# Patient Record
Sex: Female | Born: 1960 | Race: White | Hispanic: No | Marital: Married | Smoking: Former smoker
Health system: Southern US, Community
[De-identification: ages and names within clinical notes are randomized; demographics above are authoritative.]

## PROBLEM LIST (undated history)

## (undated) DIAGNOSIS — M1990 Unspecified osteoarthritis, unspecified site: Secondary | ICD-10-CM

## (undated) DIAGNOSIS — E039 Hypothyroidism, unspecified: Secondary | ICD-10-CM

## (undated) DIAGNOSIS — R011 Cardiac murmur, unspecified: Secondary | ICD-10-CM

## (undated) DIAGNOSIS — L209 Atopic dermatitis, unspecified: Secondary | ICD-10-CM

## (undated) DIAGNOSIS — E079 Disorder of thyroid, unspecified: Secondary | ICD-10-CM

## (undated) DIAGNOSIS — F4310 Post-traumatic stress disorder, unspecified: Secondary | ICD-10-CM

## (undated) DIAGNOSIS — M797 Fibromyalgia: Secondary | ICD-10-CM

## (undated) HISTORY — DX: Fibromyalgia: M79.7

## (undated) HISTORY — PX: CHOLECYSTECTOMY: SHX55

## (undated) HISTORY — PX: MOUTH SURGERY: SHX715

## (undated) HISTORY — PX: COLON SURGERY: SHX602

## (undated) HISTORY — PX: ABDOMINAL HYSTERECTOMY: SHX81

---

## 2004-02-19 ENCOUNTER — Ambulatory Visit: Payer: Self-pay | Admitting: Family Medicine

## 2005-08-18 ENCOUNTER — Ambulatory Visit: Payer: Self-pay | Admitting: Family Medicine

## 2007-03-25 ENCOUNTER — Ambulatory Visit: Payer: Self-pay | Admitting: Family Medicine

## 2007-04-07 ENCOUNTER — Ambulatory Visit: Payer: Self-pay | Admitting: Family Medicine

## 2008-10-30 ENCOUNTER — Ambulatory Visit: Payer: Self-pay | Admitting: Internal Medicine

## 2009-05-28 ENCOUNTER — Ambulatory Visit: Payer: Self-pay | Admitting: Unknown Physician Specialty

## 2010-09-03 ENCOUNTER — Ambulatory Visit: Payer: Self-pay | Admitting: Internal Medicine

## 2010-09-11 ENCOUNTER — Ambulatory Visit: Payer: Self-pay | Admitting: Otolaryngology

## 2013-12-06 ENCOUNTER — Ambulatory Visit: Payer: Self-pay | Admitting: Family Medicine

## 2013-12-12 ENCOUNTER — Ambulatory Visit: Payer: Self-pay | Admitting: Family Medicine

## 2013-12-12 HISTORY — PX: BREAST BIOPSY: SHX20

## 2013-12-13 LAB — PATHOLOGY REPORT

## 2014-06-14 ENCOUNTER — Ambulatory Visit: Payer: Self-pay | Admitting: Specialist

## 2014-10-14 ENCOUNTER — Ambulatory Visit: Payer: BC Managed Care – PPO

## 2014-10-14 ENCOUNTER — Ambulatory Visit
Admission: EM | Admit: 2014-10-14 | Discharge: 2014-10-14 | Disposition: A | Discharge status: Home / Self Care | Payer: BC Managed Care – PPO | Attending: Emergency Medicine | Admitting: Emergency Medicine

## 2014-10-14 ENCOUNTER — Ambulatory Visit (HOSPITAL_COMMUNITY): Payer: BC Managed Care – PPO

## 2014-10-14 ENCOUNTER — Encounter: Payer: Self-pay | Admitting: Emergency Medicine

## 2014-10-14 DIAGNOSIS — J40 Bronchitis, not specified as acute or chronic: Secondary | ICD-10-CM

## 2014-10-14 DIAGNOSIS — R059 Cough, unspecified: Secondary | ICD-10-CM

## 2014-10-14 DIAGNOSIS — R05 Cough: Secondary | ICD-10-CM | POA: Diagnosis not present

## 2014-10-14 DIAGNOSIS — R0989 Other specified symptoms and signs involving the circulatory and respiratory systems: Secondary | ICD-10-CM | POA: Insufficient documentation

## 2014-10-14 HISTORY — DX: Atopic dermatitis, unspecified: L20.9

## 2014-10-14 HISTORY — DX: Unspecified osteoarthritis, unspecified site: M19.90

## 2014-10-14 HISTORY — DX: Disorder of thyroid, unspecified: E07.9

## 2014-10-14 HISTORY — DX: Post-traumatic stress disorder, unspecified: F43.10

## 2014-10-14 MED ORDER — IPRATROPIUM-ALBUTEROL 0.5-2.5 (3) MG/3ML IN SOLN
3.0000 mL | Freq: Once | RESPIRATORY_TRACT | Status: AC
  Administered 2014-10-14: 3 mL via RESPIRATORY_TRACT

## 2014-10-14 MED ORDER — HYDROCOD POLST-CPM POLST ER 10-8 MG/5ML PO SUER: 5.0000 mL | Freq: Two times a day (BID) | ORAL | Status: DC | PRN

## 2014-10-14 MED ORDER — ALBUTEROL SULFATE HFA 108 (90 BASE) MCG/ACT IN AERS: 1.0000 | INHALATION_SPRAY | Freq: Four times a day (QID) | RESPIRATORY_TRACT | Status: DC | PRN

## 2014-10-14 MED ORDER — PREDNISONE 50 MG PO TABS: 50.0000 mg | ORAL_TABLET | Freq: Every day | ORAL | Status: DC

## 2014-10-14 NOTE — ED Notes (Signed)
Patient presents here with c/o cough with yellow sputum since Thursday associted with migraine, denies any fever

## 2014-10-14 NOTE — ED Provider Notes (Addendum)
HPI  SUBJECTIVE:  Anne Sutton is a 54 y.o. female who presents with cough for 4 days. States it started becoming productive of "yellow stuff" yesterday. She reports wheezing, shortness of breath with exertion, chest soreness from all the coughing. She is sleeping at night okay. No vomiting, fevers. Symptoms are better with Cheratussin, no aggravating factors. She has not tried anything else for this. She also reports sinus pain/pressure starting last night, nasal congestion, postnasal drip. Denies sore throat, reflux type symptoms. States that the headache is secondary to the coughing states it is not new or different from previous headaches. Past medical history of migraines, asthmatic bronchitis, environmental allergies. No history of CHF, emphysema, COPD, hypertension. Patient is a former smoker.    Past Medical History  Diagnosis Date  . Arthritis   . Thyroid disease   . PTSD (post-traumatic stress disorder)   . Atopic dermatitis     Past Surgical History  Procedure Laterality Date  . Abdominal hysterectomy    . Cholecystectomy    . Colon surgery    . Mouth surgery      History reviewed. No pertinent family history.  History  Substance Use Topics  . Smoking status: Never Smoker   . Smokeless tobacco: Not on file  . Alcohol Use: No    No current facility-administered medications for this encounter.  Current outpatient prescriptions:  .  clonazePAM (KLONOPIN) 0.5 MG tablet, Take 0.5 mg by mouth 2 (two) times daily as needed for anxiety., Disp: , Rfl:  .  DULoxetine (CYMBALTA) 60 MG capsule, Take 60 mg by mouth daily., Disp: , Rfl:  .  levothyroxine (SYNTHROID, LEVOTHROID) 50 MCG tablet, Take 50 mcg by mouth daily before breakfast., Disp: , Rfl:  .  traMADol (ULTRAM) 50 MG tablet, Take by mouth every 6 (six) hours as needed., Disp: , Rfl:  .  albuterol (PROVENTIL HFA;VENTOLIN HFA) 108 (90 BASE) MCG/ACT inhaler, Inhale 1-2 puffs into the lungs every 6 (six) hours as  needed for wheezing or shortness of breath., Disp: 1 Inhaler, Rfl: 0 .  chlorpheniramine-HYDROcodone (TUSSIONEX PENNKINETIC ER) 10-8 MG/5ML SUER, Take 5 mLs by mouth every 12 (twelve) hours as needed for cough., Disp: 120 mL, Rfl: 0 .  predniSONE (DELTASONE) 50 MG tablet, Take 1 tablet (50 mg total) by mouth daily with breakfast., Disp: 5 tablet, Rfl: 0  Allergies  Allergen Reactions  . Hydromorphone Swelling  . Sulfa Antibiotics Anaphylaxis  . Toradol [Ketorolac Tromethamine] Swelling     ROS  As noted in HPI.   Physical Exam  BP 144/61 mmHg  Pulse 89  Temp(Src) 97.1 F (36.2 C) (Tympanic)  Resp 18  Ht 5\' 6"  (1.676 m)  Wt 281 lb (127.461 kg)  BMI 45.38 kg/m2  SpO2 95%  Constitutional: Well developed, well nourished, no acute distress Eyes:  EOMI, conjunctiva normal bilaterally HENT: Normocephalic, atraumatic,mucus membranes moist. Positive nasal congestion, b/l maxillary sinus tenderness Respiratory: Normal inspiratory effort, scattered wheezing, rhonchi that clear with coughing Cardiovascular: Regular rate and rhythm no murmurs rubs gallops GI: nondistended skin: No rash, skin intact Musculoskeletal: no deformities Neurologic: Alert & oriented x 3, no focal neuro deficits Psychiatric: Speech and behavior appropriate   ED Course  We'll give DuoNeb as pt has wheezing, check chest x-ray rule out pneumonia, plan is to send home with bronchodilators, steroids, Cheratussin, Mucinex D. Patient has neti pot, Flonase, at home she will start this up  Medications  ipratropium-albuterol (DUONEB) 0.5-2.5 (3) MG/3ML nebulizer solution 3 mL (3 mLs  Nebulization Given 10/14/14 0910)    Orders Placed This Encounter  Procedures  . DG Chest 2 View    Standing Status: Standing     Number of Occurrences: 1     Standing Expiration Date:     Order Specific Question:  Reason for exam:    Answer:  COUGH    Order Specific Question:  Reason for exam:    Answer:  RHONCHI    No results  found for this or any previous visit (from the past 24 hour(s)). Dg Chest 2 View  10/14/2014   CLINICAL DATA:  Cough and increased rhonchi  EXAM: CHEST - 2 VIEW  COMPARISON:  06/14/2014  FINDINGS: The heart size and mediastinal contours are within normal limits. Both lungs are clear. The visualized skeletal structures are unremarkable.  IMPRESSION: No active disease.   Electronically Signed   By: Alcide Clever M.D.   On: 10/14/2014 10:53   Dg Chest 2 View  10/14/2014   CLINICAL DATA:  Cough and increased rhonchi  EXAM: CHEST - 2 VIEW  COMPARISON:  06/14/2014  FINDINGS: The heart size and mediastinal contours are within normal limits. Both lungs are clear. The visualized skeletal structures are unremarkable.  IMPRESSION: No active disease.   Electronically Signed   By: Alcide Clever M.D.   On: 10/14/2014 10:53   ED Clinical Impression  Bronchitis  ED Assessment/Plan  We'll treat with bronchodilators, steroids, cough syrup. We'll call in antibiotics if x-ray comes back positive for pneumonia.  X-ray negative for pneumonia. See radiology report for full details. Plan as above  Reviewed imaging independently.  imaging normal. See radiology report for full details.  Discussed labs, imaging, MDM, plan and followup with patient. Discussed sn/sx that should prompt return to the UC or ED. Patient agrees with plan.   *This clinic note was created using Dragon dictation software. Therefore, there may be occasional mistakes despite careful proofreading.  ?   Domenick Gong, MD 10/14/14 1106  Domenick Gong, MD 10/14/14 1108

## 2015-02-05 ENCOUNTER — Other Ambulatory Visit: Payer: Self-pay | Admitting: Family Medicine

## 2015-02-05 DIAGNOSIS — Z1231 Encounter for screening mammogram for malignant neoplasm of breast: Secondary | ICD-10-CM

## 2015-02-12 ENCOUNTER — Ambulatory Visit
Admission: RE | Admit: 2015-02-12 | Discharge: 2015-02-12 | Disposition: A | Discharge status: Home / Self Care | Payer: BC Managed Care – PPO | Source: Ambulatory Visit | Attending: Family Medicine | Admitting: Family Medicine

## 2015-02-12 DIAGNOSIS — Z1231 Encounter for screening mammogram for malignant neoplasm of breast: Secondary | ICD-10-CM

## 2015-02-20 ENCOUNTER — Encounter: Payer: Self-pay | Admitting: Obstetrics and Gynecology

## 2016-01-30 ENCOUNTER — Other Ambulatory Visit: Payer: Self-pay | Admitting: Family Medicine

## 2016-01-30 DIAGNOSIS — Z1231 Encounter for screening mammogram for malignant neoplasm of breast: Secondary | ICD-10-CM

## 2016-02-22 ENCOUNTER — Ambulatory Visit: Payer: BC Managed Care – PPO

## 2016-02-29 ENCOUNTER — Ambulatory Visit
Admission: RE | Admit: 2016-02-29 | Discharge: 2016-02-29 | Disposition: A | Discharge status: Home / Self Care | Payer: BC Managed Care – PPO | Source: Ambulatory Visit | Attending: Family Medicine | Admitting: Family Medicine

## 2016-02-29 DIAGNOSIS — Z1231 Encounter for screening mammogram for malignant neoplasm of breast: Secondary | ICD-10-CM | POA: Insufficient documentation

## 2016-10-07 IMAGING — CR DG CHEST 2V
1 series · 2 of 2 positions shown · non-contrast
Comparison: None.

CLINICAL DATA: Preoperative evaluation prior to bariatric surgery,
morbid obesity, sleep apnea.

EXAM:
CHEST  2 VIEW

[Series 1: dxr chest pa (or ap) and lateral · 0.14mm/px · 2 of 2 slices shown]
[im 1/2]
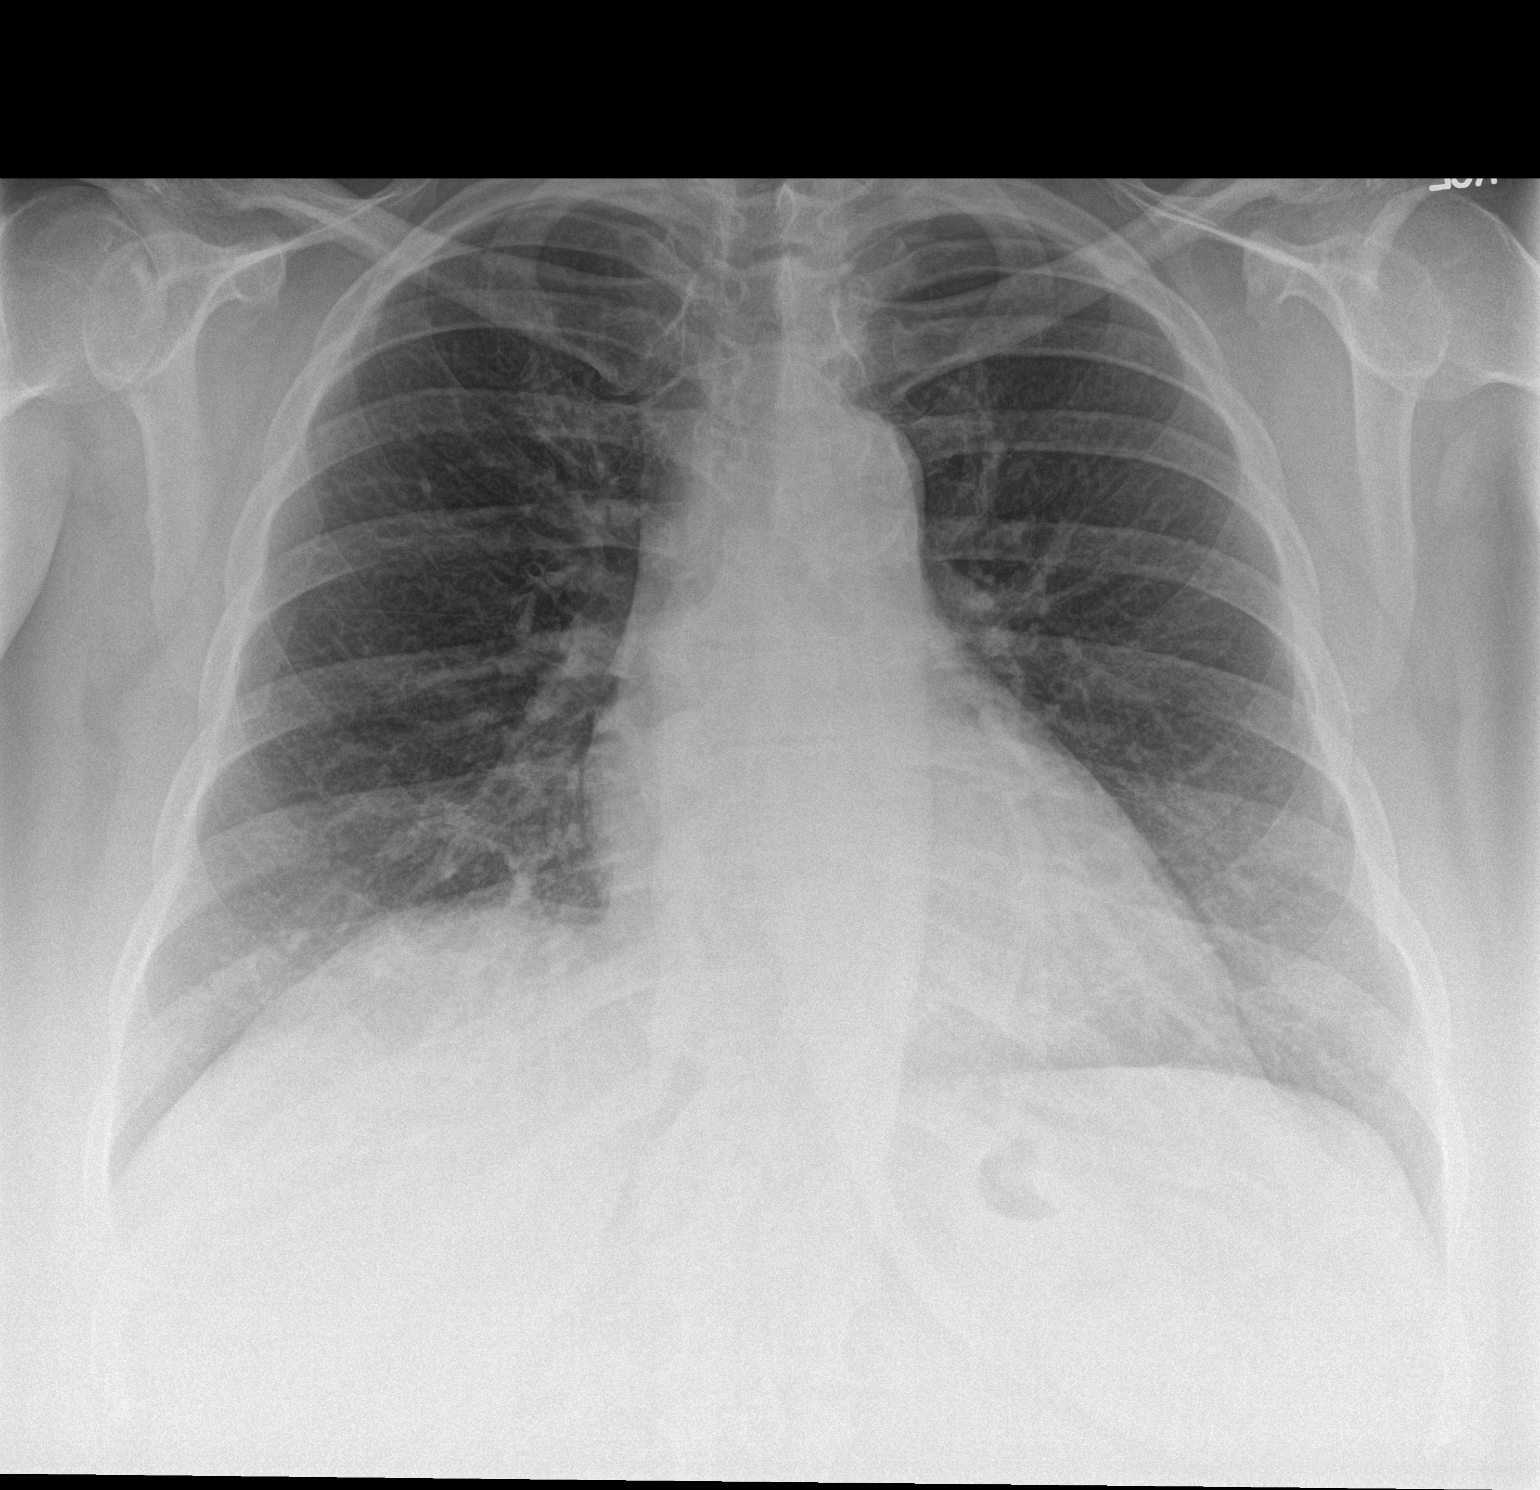
[im 2/2]
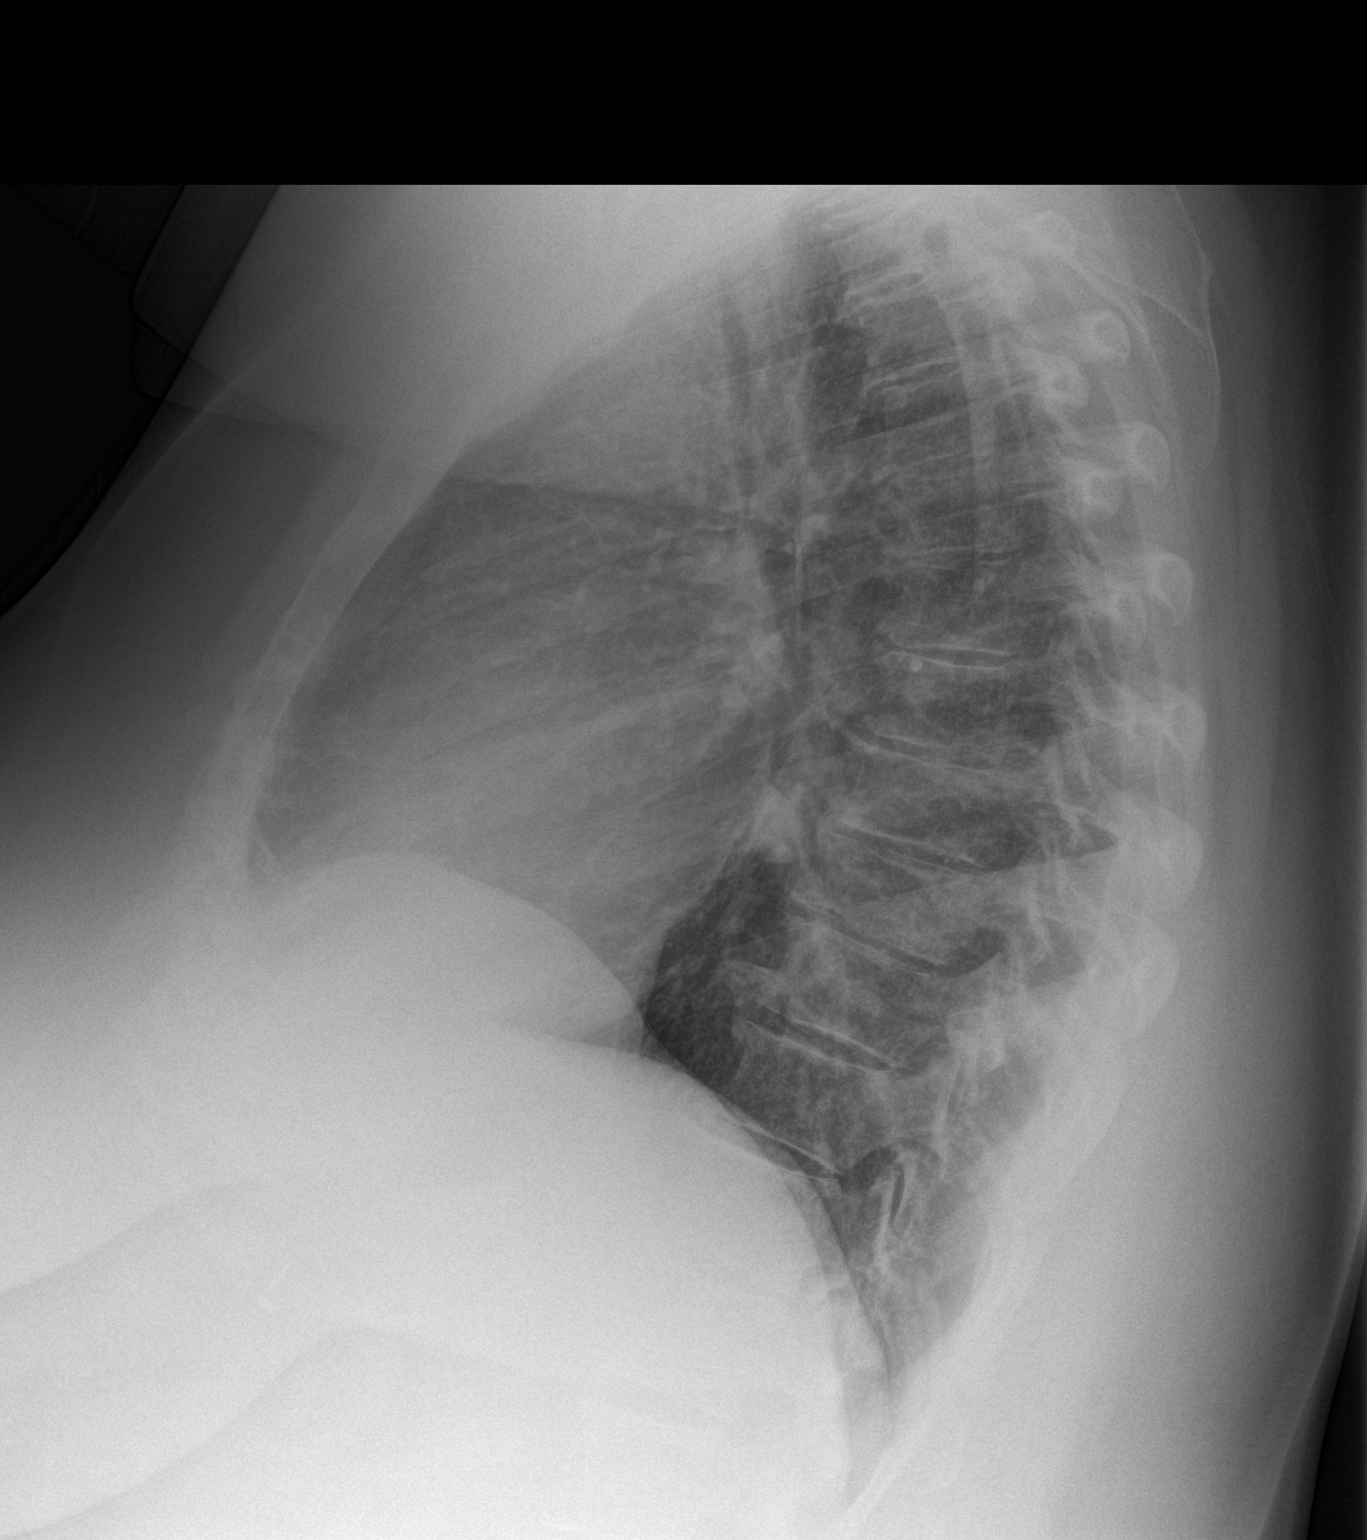

[2 of 2 positions shown; findings below may reference images not displayed]

FINDINGS: The lungs are adequately inflated. There is no focal infiltrate.
There is no pleural effusion. The heart and pulmonary vascularity
are normal. The mediastinum is normal in width. There is mild
degenerative disc space narrowing at multiple thoracic levels. There
is AC joint degenerative change.
IMPRESSION: There is no active cardiopulmonary disease.

## 2017-12-25 ENCOUNTER — Other Ambulatory Visit: Payer: Self-pay | Admitting: Family Medicine

## 2017-12-25 DIAGNOSIS — Z1231 Encounter for screening mammogram for malignant neoplasm of breast: Secondary | ICD-10-CM

## 2018-01-15 ENCOUNTER — Ambulatory Visit
Admission: RE | Admit: 2018-01-15 | Discharge: 2018-01-15 | Disposition: A | Discharge status: Home / Self Care | Payer: BC Managed Care – PPO | Source: Ambulatory Visit | Attending: Family Medicine | Admitting: Family Medicine

## 2018-01-15 DIAGNOSIS — Z1231 Encounter for screening mammogram for malignant neoplasm of breast: Secondary | ICD-10-CM | POA: Insufficient documentation

## 2019-05-16 ENCOUNTER — Other Ambulatory Visit: Payer: Self-pay | Admitting: Family Medicine

## 2019-05-16 DIAGNOSIS — Z1231 Encounter for screening mammogram for malignant neoplasm of breast: Secondary | ICD-10-CM

## 2019-05-19 ENCOUNTER — Ambulatory Visit
Admission: RE | Admit: 2019-05-19 | Discharge: 2019-05-19 | Disposition: A | Discharge status: Home / Self Care | Payer: BC Managed Care – PPO | Source: Ambulatory Visit | Attending: Family Medicine | Admitting: Family Medicine

## 2019-05-19 DIAGNOSIS — Z1231 Encounter for screening mammogram for malignant neoplasm of breast: Secondary | ICD-10-CM | POA: Insufficient documentation

## 2021-04-01 ENCOUNTER — Other Ambulatory Visit: Payer: Self-pay | Admitting: Family Medicine

## 2021-04-01 DIAGNOSIS — Z1231 Encounter for screening mammogram for malignant neoplasm of breast: Secondary | ICD-10-CM

## 2021-04-02 ENCOUNTER — Other Ambulatory Visit: Payer: Self-pay | Admitting: Family Medicine

## 2021-04-09 ENCOUNTER — Other Ambulatory Visit: Payer: Self-pay | Admitting: Family Medicine

## 2021-04-09 DIAGNOSIS — Z780 Asymptomatic menopausal state: Secondary | ICD-10-CM

## 2021-06-05 ENCOUNTER — Ambulatory Visit
Admission: RE | Admit: 2021-06-05 | Discharge: 2021-06-05 | Disposition: A | Discharge status: Home / Self Care | Payer: BC Managed Care – PPO | Source: Ambulatory Visit | Attending: Family Medicine | Admitting: Family Medicine

## 2021-06-05 ENCOUNTER — Other Ambulatory Visit: Payer: Self-pay

## 2021-06-05 DIAGNOSIS — Z780 Asymptomatic menopausal state: Secondary | ICD-10-CM

## 2021-06-05 DIAGNOSIS — Z1231 Encounter for screening mammogram for malignant neoplasm of breast: Secondary | ICD-10-CM | POA: Diagnosis not present

## 2021-10-24 ENCOUNTER — Encounter: Payer: Self-pay | Admitting: *Deleted

## 2021-10-25 ENCOUNTER — Ambulatory Visit
Admission: RE | Admit: 2021-10-25 | Discharge: 2021-10-25 | Disposition: A | Discharge status: Home / Self Care | Payer: BC Managed Care – PPO | Attending: Gastroenterology | Admitting: Gastroenterology

## 2021-10-25 ENCOUNTER — Ambulatory Visit: Payer: BC Managed Care – PPO | Admitting: Anesthesiology

## 2021-10-25 ENCOUNTER — Encounter
Admission: RE | Disposition: A | Discharge status: Home / Self Care | Payer: Self-pay | Source: Home / Self Care | Attending: Gastroenterology

## 2021-10-25 DIAGNOSIS — Q438 Other specified congenital malformations of intestine: Secondary | ICD-10-CM | POA: Diagnosis not present

## 2021-10-25 DIAGNOSIS — K640 First degree hemorrhoids: Secondary | ICD-10-CM | POA: Diagnosis not present

## 2021-10-25 DIAGNOSIS — E039 Hypothyroidism, unspecified: Secondary | ICD-10-CM | POA: Diagnosis not present

## 2021-10-25 DIAGNOSIS — K5730 Diverticulosis of large intestine without perforation or abscess without bleeding: Secondary | ICD-10-CM | POA: Diagnosis not present

## 2021-10-25 DIAGNOSIS — Z6833 Body mass index (BMI) 33.0-33.9, adult: Secondary | ICD-10-CM | POA: Diagnosis not present

## 2021-10-25 DIAGNOSIS — M1990 Unspecified osteoarthritis, unspecified site: Secondary | ICD-10-CM | POA: Insufficient documentation

## 2021-10-25 DIAGNOSIS — Z8371 Family history of colonic polyps: Secondary | ICD-10-CM | POA: Insufficient documentation

## 2021-10-25 DIAGNOSIS — Z1211 Encounter for screening for malignant neoplasm of colon: Secondary | ICD-10-CM | POA: Insufficient documentation

## 2021-10-25 HISTORY — DX: Hypothyroidism, unspecified: E03.9

## 2021-10-25 HISTORY — DX: Cardiac murmur, unspecified: R01.1

## 2021-10-25 HISTORY — PX: COLONOSCOPY WITH PROPOFOL: SHX5780

## 2021-10-25 SURGERY — COLONOSCOPY WITH PROPOFOL
Anesthesia: General

## 2021-10-25 MED ORDER — PROPOFOL 10 MG/ML IV BOLUS
INTRAVENOUS | Status: DC | PRN
  Administered 2021-10-25: 60 mg via INTRAVENOUS

## 2021-10-25 MED ORDER — PROPOFOL 500 MG/50ML IV EMUL
INTRAVENOUS | Status: DC | PRN
  Administered 2021-10-25: 200 ug/kg/min via INTRAVENOUS

## 2021-10-25 MED ORDER — SODIUM CHLORIDE 0.9 % IV SOLN: INTRAVENOUS | Status: DC

## 2021-10-25 NOTE — Anesthesia Postprocedure Evaluation (Signed)
Anesthesia Post Note  Patient: Anne Sutton  Procedure(s) Performed: COLONOSCOPY WITH PROPOFOL  Patient location during evaluation: PACU Anesthesia Type: General Level of consciousness: awake and oriented Pain management: pain level controlled Vital Signs Assessment: post-procedure vital signs reviewed and stable Respiratory status: respiratory function stable and nonlabored ventilation Cardiovascular status: stable Anesthetic complications: no   No notable events documented.   Last Vitals:  Vitals:   10/25/21 1115 10/25/21 1125  BP: (!) 108/55 123/65  Pulse: 74   Resp: 13 12  Temp:    SpO2: 100% 100%    Last Pain:  Vitals:   10/25/21 1125  TempSrc:   PainSc: 0-No pain                 VAN STAVEREN,Ashlen Kiger

## 2021-10-28 ENCOUNTER — Encounter: Payer: Self-pay | Admitting: Gastroenterology

## 2023-03-01 ENCOUNTER — Emergency Department
Admission: EM | Admit: 2023-03-01 | Discharge: 2023-03-01 | Disposition: A | Discharge status: Home / Self Care | Payer: BC Managed Care – PPO | Attending: Emergency Medicine | Admitting: Emergency Medicine

## 2023-03-01 ENCOUNTER — Other Ambulatory Visit: Payer: Self-pay

## 2023-03-01 DIAGNOSIS — S8011XA Contusion of right lower leg, initial encounter: Secondary | ICD-10-CM | POA: Diagnosis not present

## 2023-03-01 DIAGNOSIS — S0083XA Contusion of other part of head, initial encounter: Secondary | ICD-10-CM | POA: Insufficient documentation

## 2023-03-01 DIAGNOSIS — W231XXA Caught, crushed, jammed, or pinched between stationary objects, initial encounter: Secondary | ICD-10-CM | POA: Diagnosis not present

## 2023-03-01 DIAGNOSIS — S0990XA Unspecified injury of head, initial encounter: Secondary | ICD-10-CM
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
## 2023-03-01 NOTE — Discharge Instructions (Signed)
Your exam was reassuring today. Since you did not have any neurological deficits at this time we held off on getting imaging. If in the next few hours or days you have worsening headaches, multiple episodes of vomiting, dizziness, blurry vision, difficulty walking or memory issues please return to the ED.  You can take 650 mg of Tylenol and 600 mg of ibuprofen every 6 hours as needed for pain.

## 2023-03-23 ENCOUNTER — Other Ambulatory Visit: Payer: Self-pay | Admitting: Family Medicine

## 2023-03-23 DIAGNOSIS — Z1231 Encounter for screening mammogram for malignant neoplasm of breast: Secondary | ICD-10-CM

## 2023-05-14 ENCOUNTER — Ambulatory Visit
Admission: RE | Admit: 2023-05-14 | Discharge: 2023-05-14 | Disposition: A | Discharge status: Home / Self Care | Payer: 59 | Source: Ambulatory Visit | Attending: Family Medicine | Admitting: Family Medicine

## 2023-05-14 DIAGNOSIS — Z1231 Encounter for screening mammogram for malignant neoplasm of breast: Secondary | ICD-10-CM | POA: Diagnosis present

## 2023-09-29 IMAGING — MG MM DIGITAL SCREENING BILAT W/ TOMO AND CAD
8 series · 8 of 24 positions shown · non-contrast
Comparison: Previous exam(s).

CLINICAL DATA: Screening.

EXAM:
DIGITAL SCREENING BILATERAL MAMMOGRAM WITH TOMOSYNTHESIS AND CAD
TECHNIQUE: Bilateral screening digital craniocaudal and mediolateral oblique
mammograms were obtained. Bilateral screening digital breast
tomosynthesis was performed. The images were evaluated with
computer-aided detection.

[R CC synth-2D]
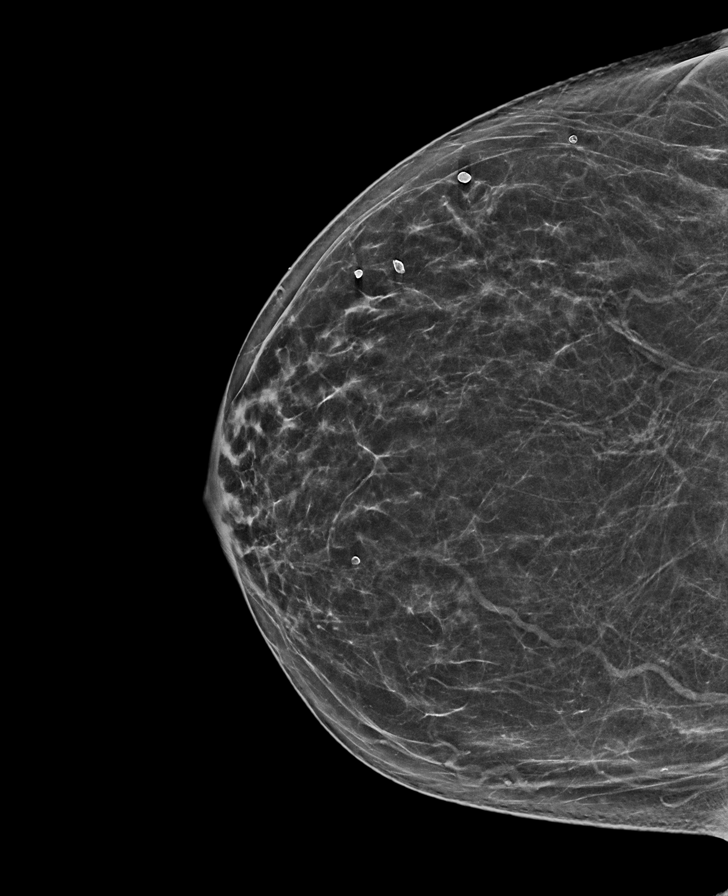

[L MLO synth-2D]
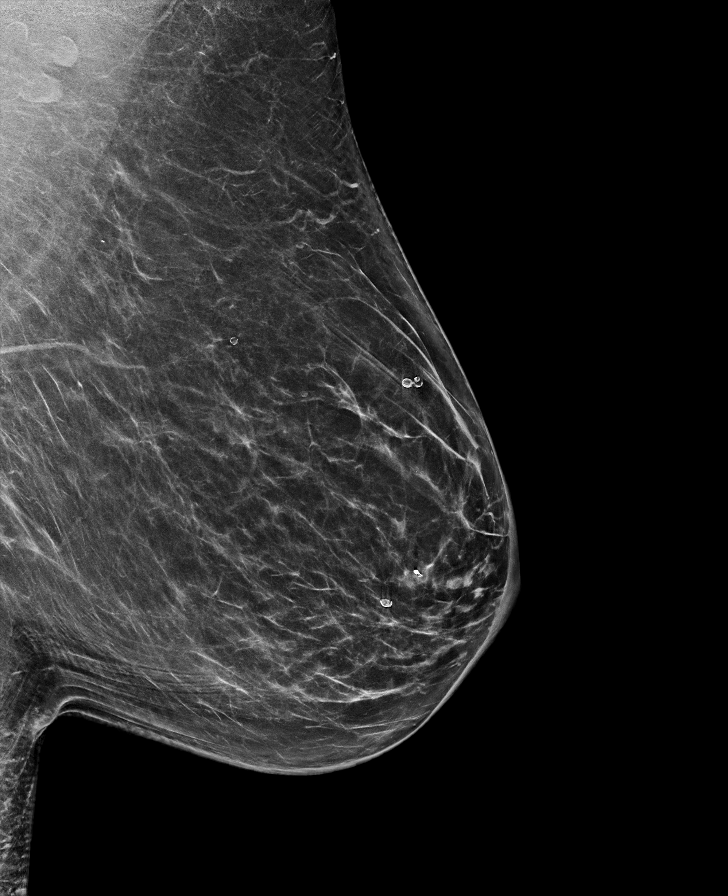

[R MLO synth-2D]
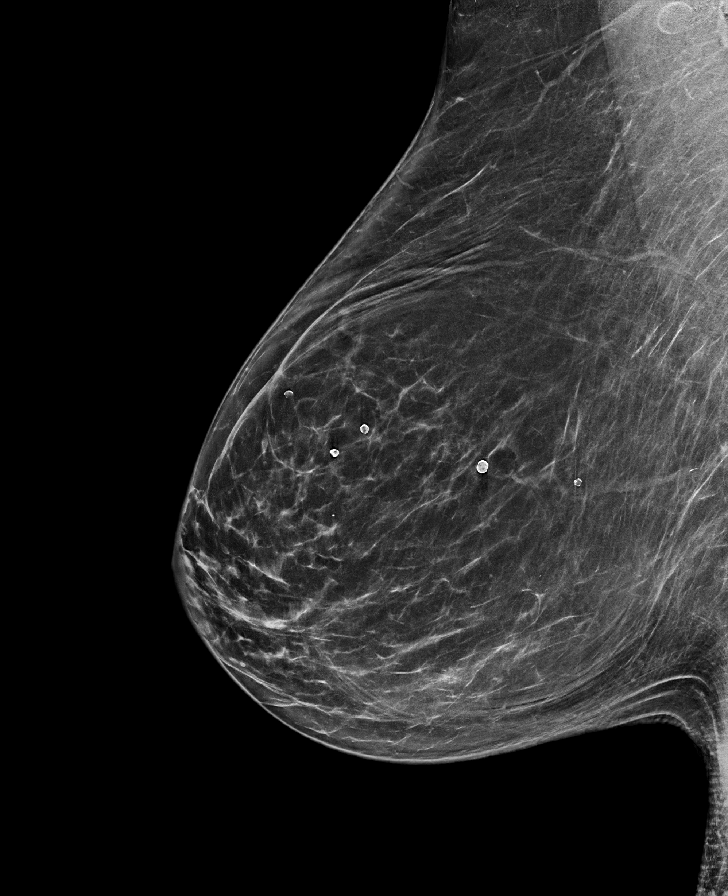

[L CC synth-2D]
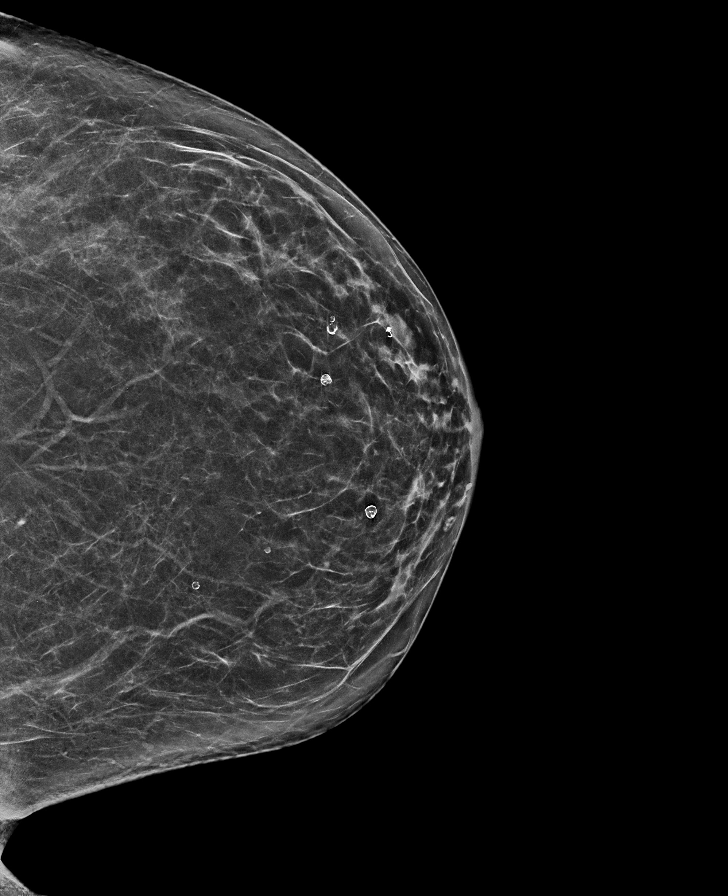

[R CC tomo · tomo slice 35/70.0]
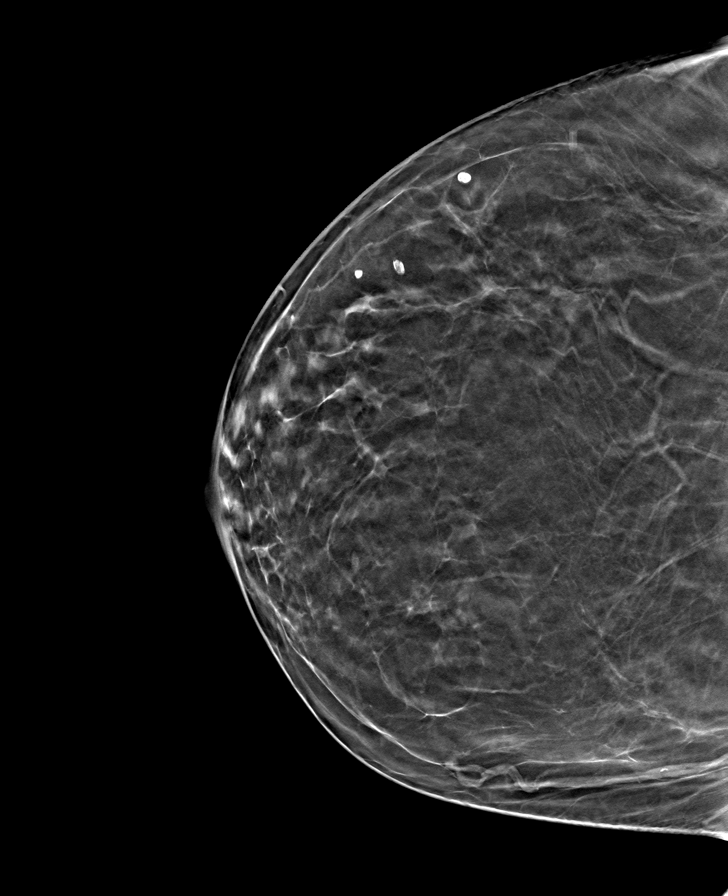

[L CC tomo · tomo slice 37/74.0]
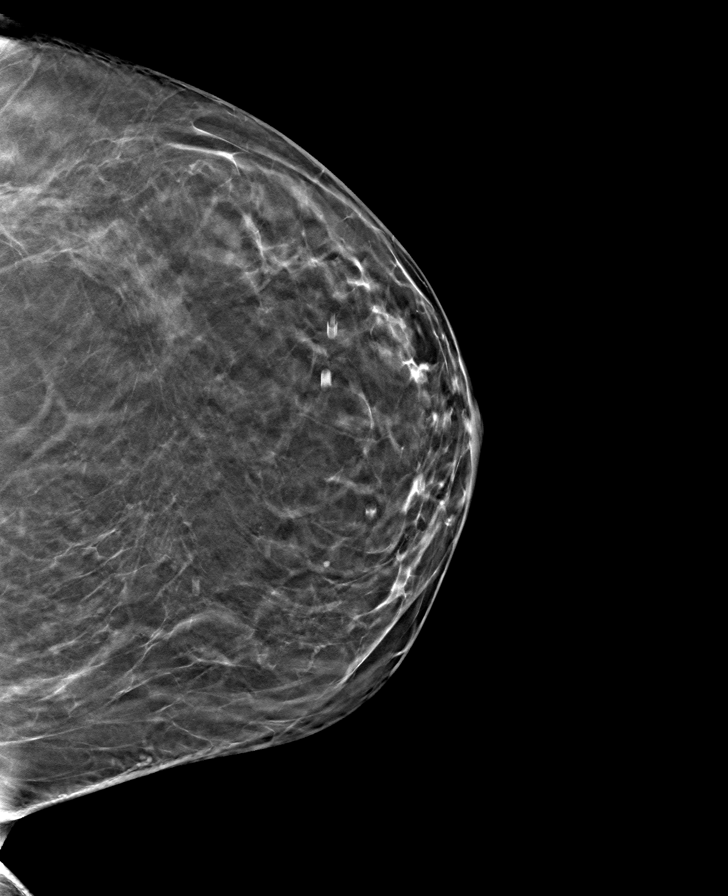

[R MLO tomo · tomo slice 41/82.0]
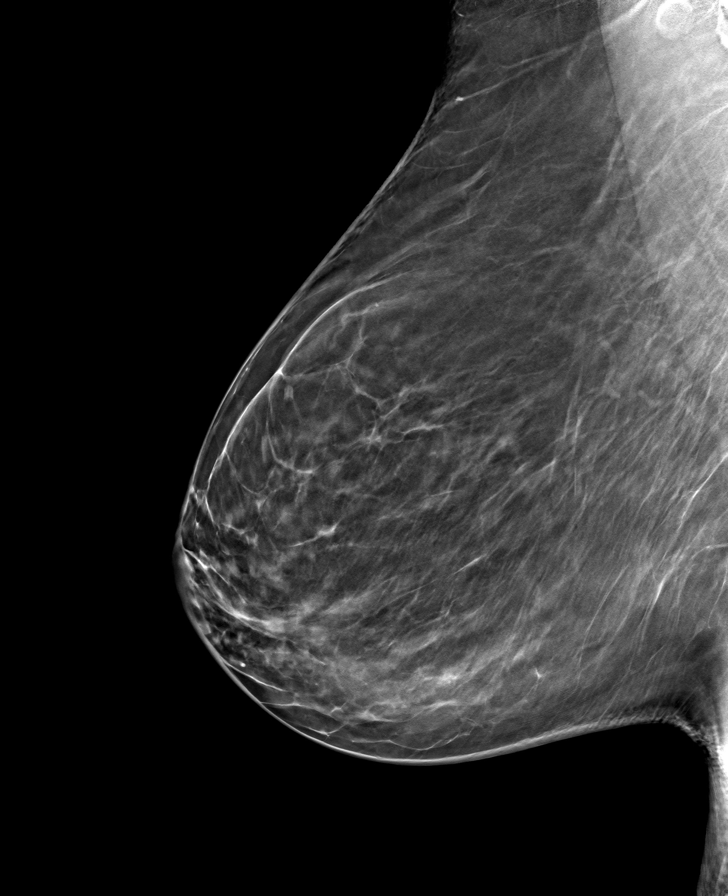

[L MLO tomo · tomo slice 43/84.0]
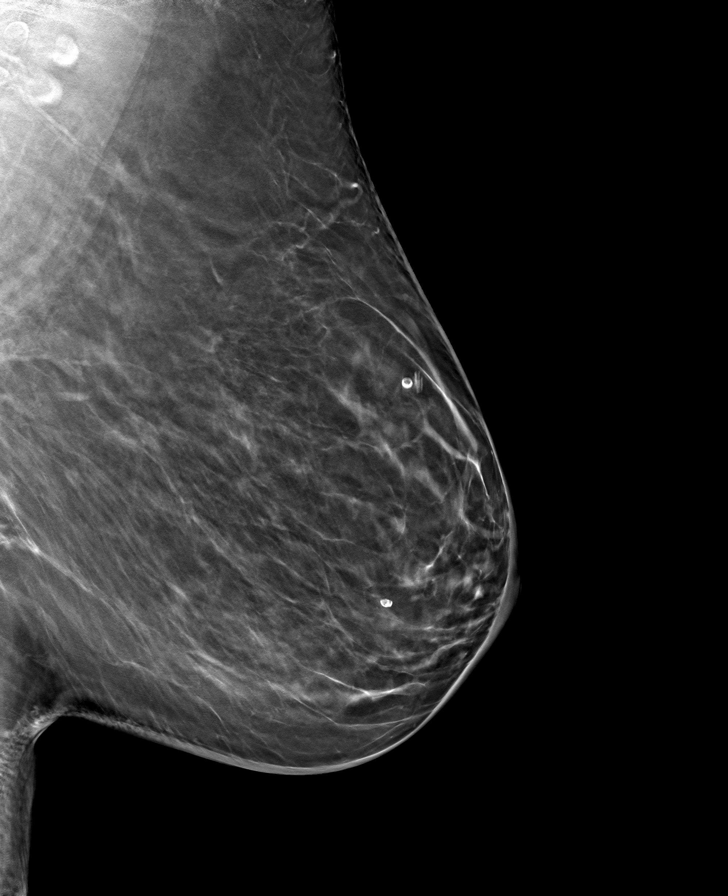

[8 of 24 positions shown; findings below may reference images not displayed]

ACR Breast Density Category b: There are scattered areas of
fibroglandular density.
FINDINGS: There are no findings suspicious for malignancy.
IMPRESSION: No mammographic evidence of malignancy. A result letter of this
screening mammogram will be mailed directly to the patient.

RECOMMENDATION:
Screening mammogram in one year. (Code:51-O-LD2)

BI-RADS CATEGORY  1: Negative.

## 2024-04-11 ENCOUNTER — Inpatient Hospital Stay: Attending: Oncology | Admitting: Oncology

## 2024-04-11 ENCOUNTER — Encounter: Payer: Self-pay | Admitting: Oncology

## 2024-04-11 ENCOUNTER — Inpatient Hospital Stay

## 2024-04-11 VITALS — BP 150/70 | HR 58 | Temp 97.4°F | Resp 18 | Wt 208.1 lb

## 2024-04-11 DIAGNOSIS — Z87891 Personal history of nicotine dependence: Secondary | ICD-10-CM | POA: Diagnosis not present

## 2024-04-11 DIAGNOSIS — Z808 Family history of malignant neoplasm of other organs or systems: Secondary | ICD-10-CM | POA: Insufficient documentation

## 2024-04-11 DIAGNOSIS — D72810 Lymphocytopenia: Secondary | ICD-10-CM

## 2024-04-11 DIAGNOSIS — D508 Other iron deficiency anemias: Secondary | ICD-10-CM | POA: Diagnosis present

## 2024-04-11 DIAGNOSIS — Z9884 Bariatric surgery status: Secondary | ICD-10-CM | POA: Diagnosis not present

## 2024-04-11 DIAGNOSIS — D509 Iron deficiency anemia, unspecified: Secondary | ICD-10-CM | POA: Insufficient documentation

## 2024-04-11 LAB — CBC WITH DIFFERENTIAL/PLATELET
Abs Immature Granulocytes: 0.02 K/uL (ref 0.00–0.07)
Basophils Absolute: 0.1 K/uL (ref 0.0–0.1)
Basophils Relative: 1 %
Eosinophils Absolute: 0.2 K/uL (ref 0.0–0.5)
Eosinophils Relative: 4 %
HCT: 34.2 % — ABNORMAL LOW (ref 36.0–46.0)
Hemoglobin: 10.6 g/dL — ABNORMAL LOW (ref 12.0–15.0)
Immature Granulocytes: 0 %
Lymphocytes Relative: 27 %
Lymphs Abs: 1.5 K/uL (ref 0.7–4.0)
MCH: 27.3 pg (ref 26.0–34.0)
MCHC: 31 g/dL (ref 30.0–36.0)
MCV: 88.1 fL (ref 80.0–100.0)
Monocytes Absolute: 0.4 K/uL (ref 0.1–1.0)
Monocytes Relative: 7 %
Neutro Abs: 3.4 K/uL (ref 1.7–7.7)
Neutrophils Relative %: 61 %
Platelets: 221 K/uL (ref 150–400)
RBC: 3.88 MIL/uL (ref 3.87–5.11)
RDW: 15.8 % — ABNORMAL HIGH (ref 11.5–15.5)
WBC: 5.5 K/uL (ref 4.0–10.5)
nRBC: 0 % (ref 0.0–0.2)

## 2024-04-11 LAB — RETIC PANEL
Immature Retic Fract: 7.4 % (ref 2.3–15.9)
RBC.: 3.85 MIL/uL — ABNORMAL LOW (ref 3.87–5.11)
Retic Count, Absolute: 44.7 K/uL (ref 19.0–186.0)
Retic Ct Pct: 1.2 % (ref 0.4–3.1)
Reticulocyte Hemoglobin: 32.4 pg (ref >27.9)

## 2024-04-11 LAB — HEPATIC FUNCTION PANEL
ALT: 23 U/L (ref 0–44)
AST: 22 U/L (ref 15–41)
Albumin: 4.4 g/dL (ref 3.5–5.0)
Alkaline Phosphatase: 66 U/L (ref 38–126)
Bilirubin, Direct: 0.1 mg/dL (ref 0.0–0.2)
Indirect Bilirubin: 0.1 mg/dL — ABNORMAL LOW (ref 0.3–0.9)
Total Bilirubin: 0.2 mg/dL (ref 0.0–1.2)
Total Protein: 7.1 g/dL (ref 6.5–8.1)

## 2024-04-11 LAB — LACTATE DEHYDROGENASE: LDH: 188 U/L (ref 105–235)

## 2024-04-11 NOTE — Assessment & Plan Note (Signed)
 Labs are reviewed and discussed with patient. Consistent with iron deficiency anemia.  Today's cbc showed hb of 10.6, iron levels are pending.   I discussed about option IV Venofer treatments. I discussed about the potential risks including but not limited to allergic reactions/infusion reactions including anaphylactic reactions, diarrhea, phlebitis, high blood pressure, wheezing, SOB, skin rash, weight gain,dark urine, leg swelling, back pain, headache, nausea and fatigue, etc. Patient agrees with the plan.  Plan IV venofer weekly x 3

## 2024-04-11 NOTE — Assessment & Plan Note (Signed)
Check B12, and folate.

## 2024-04-11 NOTE — Progress Notes (Unsigned)
Pt here to establish care for anemia.  °

## 2024-04-11 NOTE — Assessment & Plan Note (Addendum)
 Check flowcytometry, b12, folate, ldh Resolved on repeat cbc today

## 2024-04-11 NOTE — Progress Notes (Unsigned)
 Hematology/Oncology Consult note [phone] [phone]        REFERRING PROVIDER: Donal Channing SQUIBB, FNP   CHIEF COMPLAINTS/REASON FOR VISIT:  Evaluation of IDA   ASSESSMENT & PLAN:   Iron deficiency anemia Labs are reviewed and discussed with patient. Consistent with iron deficiency anemia.  Today's cbc showed hb of 10.6, iron levels are pending.   I discussed about option IV Venofer treatments. I discussed about the potential risks including but not limited to allergic reactions/infusion reactions including anaphylactic reactions, diarrhea, phlebitis, high blood pressure, wheezing, SOB, skin rash, weight gain,dark urine, leg swelling, back pain, headache, nausea and fatigue, etc. Patient agrees with the plan.  Plan IV venofer weekly x 3  Lymphopenia Check flowcytometry, b12, folate, ldh Resolved on repeat cbc today  History of bariatric surgery Check B12, and folate   Orders Placed This Encounter  Procedures   Folate    Standing Status:   Future    Number of Occurrences:   1    Expected Date:   04/11/2024    Expiration Date:   07/10/2024   Iron and TIBC    Standing Status:   Future    Number of Occurrences:   1    Expected Date:   04/11/2024    Expiration Date:   07/10/2024   Ferritin    Standing Status:   Future    Number of Occurrences:   1    Expected Date:   04/11/2024    Expiration Date:   07/10/2024   Vitamin B12    Standing Status:   Future    Number of Occurrences:   1    Expected Date:   04/11/2024    Expiration Date:   07/10/2024   CBC with Differential/Platelet    Standing Status:   Future    Number of Occurrences:   1    Expected Date:   04/11/2024    Expiration Date:   07/10/2024   Retic Panel    Standing Status:   Future    Number of Occurrences:   1    Expected Date:   04/11/2024    Expiration Date:   07/10/2024   Flow cytometry panel-leukemia/lymphoma work-up    Standing Status:   Future    Number of Occurrences:   1     Expected Date:   04/11/2024    Expiration Date:   07/10/2024   Hepatic function panel    Standing Status:   Future    Number of Occurrences:   1    Expected Date:   04/11/2024    Expiration Date:   07/10/2024   HIV Antibody (routine testing w rflx)    Standing Status:   Future    Number of Occurrences:   1    Expected Date:   04/11/2024    Expiration Date:   07/10/2024   Lactate dehydrogenase    Standing Status:   Future    Number of Occurrences:   1    Expected Date:   04/11/2024    Expiration Date:   07/10/2024    All questions were answered. The patient knows to call the clinic with any problems, questions or concerns.  Zelphia Cap, MD, PhD [facility] Hematology Oncology 04/11/2024   HISTORY OF PRESENTING ILLNESS:   Anne Sutton is a  63 y.o.  female with PMH listed below was seen in consultation at the request of  Donal Channing SQUIBB, FNP  for evaluation of IDA Discussed  the use of AI scribe software for clinical note transcription with the patient, who gave verbal consent to proceed.   Anne Sutton has longstanding iron deficiency anemia, previously requiring vaginal hysterectomy for refractory menorrhagia. Since undergoing bariatric surgery approximately 10-11 years ago, Anne Sutton has had  low ferritin (most recently 4.9 ng/mL), low iron saturation, and hemoglobin around 10 g/dL on her lab work obtain by PCP. Anne Sutton has tried a few weeks of oral iron supplementation.  Anne Sutton continues to experience chronic fatigue and low energy, which Anne Sutton describes as always down. Anne Sutton denies shortness of breath, chest pain, palpitations, and current gastrointestinal bleeding. Anne Sutton underwent colonoscopy in summer 2023.  Anne Sutton takes a multivitamin containing B12 and folate,Anne Sutton has tried seven-month gluten-free diet which makes her feel well.     MEDICAL HISTORY:  Past Medical History:  Diagnosis Date   Arthritis    Atopic dermatitis    Fibromyalgia    Heart murmur    Hypothyroidism    PTSD (post-traumatic stress  disorder)    Thyroid disease     SURGICAL HISTORY: Past Surgical History:  Procedure Laterality Date   ABDOMINAL HYSTERECTOMY     BREAST BIOPSY Left 12/12/2013   neg   CHOLECYSTECTOMY     COLON SURGERY     COLONOSCOPY WITH PROPOFOL  N/A 10/25/2021   Procedure: COLONOSCOPY WITH PROPOFOL ;  Surgeon: Maryruth Ole DASEN, MD;  Location: [facility] ENDOSCOPY;  Service: Endoscopy;  Laterality: N/A;   DILATION AND CURETTAGE OF UTERUS     HERNIA REPAIR     MOUTH SURGERY     TONSILLECTOMY     VAGINECTOMY, PARTIAL      SOCIAL HISTORY: Social History   Socioeconomic History   Marital status: Married    Spouse name: Not on file   Number of children: Not on file   Years of education: Not on file   Highest education level: Not on file  Occupational History   Not on file  Tobacco Use   Smoking status: Former    Types: Cigarettes   Smokeless tobacco: Not on file  Vaping Use   Vaping status: Never Used  Substance and Sexual Activity   Alcohol use: No   Drug use: Not on file   Sexual activity: Not on file  Other Topics Concern   Not on file  Social History Narrative   Not on file   Social Drivers of Health   Financial Resource Strain: Not on file  Food Insecurity: No Food Insecurity (04/11/2024)   Hunger Vital Sign    Worried About Running Out of Food in the Last Year: Never true    Ran Out of Food in the Last Year: Never true  Transportation Needs: No Transportation Needs (04/11/2024)   PRAPARE - Administrator, Civil Service (Medical): No    Lack of Transportation (Non-Medical): No  Physical Activity: Not on file  Stress: Not on file  Social Connections: Not on file  Intimate Partner Violence: Not At Risk (04/11/2024)   Humiliation, Afraid, Rape, and Kick questionnaire    Fear of Current or Ex-Partner: No    Emotionally Abused: No    Physically Abused: No    Sexually Abused: No    FAMILY HISTORY: Family History  Problem Relation Age of Onset   Alcohol abuse  Mother    Diabetes Father    Hypertension Brother    Diabetes Brother    Tongue cancer Maternal Uncle    Celiac disease Daughter    Breast cancer Neg Hx  ALLERGIES:  is allergic to hydromorphone, sulfa antibiotics, toradol [ketorolac tromethamine], and tape.  MEDICATIONS:  Current Outpatient Medications  Medication Sig Dispense Refill   calcium citrate (CALCITRATE - DOSED IN MG ELEMENTAL CALCIUM) 950 (200 Ca) MG tablet Take 200 mg of elemental calcium by mouth 2 (two) times daily.     Cholecalciferol (VITAMIN D3) 1.25 MG (50000 UT) CAPS Take by mouth once a week. (Patient taking differently: Take by mouth 2 (two) times a week.)     diclofenac (VOLTAREN) 75 MG EC tablet Take 75 mg by mouth at bedtime.     DULoxetine (CYMBALTA) 60 MG capsule Take 60 mg by mouth daily.     ferrous sulfate 325 (65 FE) MG EC tablet Take 325 mg by mouth 2 (two) times daily.     melatonin 3 MG TABS tablet Take 3 mg by mouth at bedtime.     Multiple Vitamin (MULTIVITAMIN) tablet Take 1 tablet by mouth daily.     omeprazole (PRILOSEC OTC) 20 MG tablet Take 20 mg by mouth daily.     VALIUM 5 MG tablet Take 5 mg by mouth daily.     cetirizine (ZYRTEC) 10 MG tablet Take 10 mg by mouth daily. (Patient not taking: Reported on 04/11/2024)     No current facility-administered medications for this visit.    Review of Systems  Constitutional:  Positive for fatigue. Negative for appetite change, chills and fever.  HENT:   Negative for hearing loss and voice change.   Eyes:  Negative for eye problems.  Respiratory:  Negative for chest tightness and cough.   Cardiovascular:  Negative for chest pain.  Gastrointestinal:  Negative for abdominal distention, abdominal pain and blood in stool.  Endocrine: Negative for hot flashes.  Genitourinary:  Negative for difficulty urinating and frequency.   Musculoskeletal:  Negative for arthralgias.  Skin:  Negative for itching and rash.  Neurological:  Negative for extremity  weakness.  Hematological:  Negative for adenopathy.  Psychiatric/Behavioral:  Negative for confusion.    PHYSICAL EXAMINATION:  Vitals:   04/11/24 1458 04/11/24 1509  BP: (!) 155/73 (!) 150/70  Pulse: 72 (!) 58  Resp: 18   Temp: (!) 97.4 F (36.3 C)    Filed Weights   04/11/24 1458  Weight: 208 lb 1.6 oz (94.4 kg)    Physical Exam Constitutional:      General: Anne Sutton is not in acute distress. HENT:     Head: Normocephalic and atraumatic.  Eyes:     General: No scleral icterus. Cardiovascular:     Rate and Rhythm: Normal rate and regular rhythm.  Pulmonary:     Effort: Pulmonary effort is normal. No respiratory distress.     Breath sounds: Normal breath sounds. No wheezing.  Abdominal:     General: Bowel sounds are normal. There is no distension.     Palpations: Abdomen is soft.  Musculoskeletal:        General: No deformity. Normal range of motion.     Cervical back: Normal range of motion and neck supple.  Skin:    General: Skin is warm and dry.     Findings: No erythema or rash.  Neurological:     Mental Status: Anne Sutton is alert and oriented to person, place, and time. Mental status is at baseline.  Psychiatric:        Mood and Affect: Mood normal.     LABORATORY DATA:  I have reviewed the data as listed    Latest Ref Rng &  Units 04/11/2024    3:38 PM  CBC  WBC 4.0 - 10.5 K/uL 5.5   Hemoglobin 12.0 - 15.0 g/dL 89.3   Hematocrit 63.9 - 46.0 % 34.2   Platelets 150 - 400 K/uL 221       Latest Ref Rng & Units 04/11/2024    3:38 PM  CMP  Total Protein 6.5 - 8.1 g/dL 7.1   Total Bilirubin 0.0 - 1.2 mg/dL 0.2   Alkaline Phos 38 - 126 U/L 66   AST 15 - 41 U/L 22   ALT 0 - 44 U/L 23       RADIOGRAPHIC STUDIES: I have personally reviewed the radiological images as listed and agreed with the findings in the report. No results found.

## 2024-04-12 LAB — IRON AND TIBC
Iron: 31 ug/dL (ref 28–170)
Saturation Ratios: 7 % — ABNORMAL LOW (ref 10.4–31.8)
TIBC: 456 ug/dL — ABNORMAL HIGH (ref 250–450)
UIBC: 426 ug/dL

## 2024-04-12 LAB — FERRITIN: Ferritin: 14 ng/mL (ref 11–307)

## 2024-04-12 LAB — FOLATE: Folate: >20 ng/mL (ref >5.9)

## 2024-04-12 LAB — HIV ANTIBODY (ROUTINE TESTING W REFLEX): HIV Screen 4th Generation wRfx: NONREACTIVE

## 2024-04-12 LAB — VITAMIN B12: Vitamin B-12: 563 pg/mL (ref 180–914)

## 2024-04-13 ENCOUNTER — Ambulatory Visit: Payer: Self-pay | Admitting: Oncology

## 2024-04-13 ENCOUNTER — Encounter: Payer: Self-pay | Admitting: Oncology

## 2024-04-14 ENCOUNTER — Encounter: Payer: Self-pay | Admitting: Oncology

## 2024-04-14 LAB — COMP PANEL: LEUKEMIA/LYMPHOMA

## 2024-04-14 NOTE — Telephone Encounter (Signed)
 Anne Sutton can you please schedule and notify patient of appointment dates and times.

## 2024-04-14 NOTE — Telephone Encounter (Signed)
-----   Message from Zelphia Cap, MD sent at 04/13/2024  9:08 PM EST ----- Please arrange patient to get IV Venofer weekly x 3. Follow-up appointment in 3 months, lab prior to MD +/- Venofer.  Labs are ordered.  Thank you ----- Message ----- From: Rebecka, Lab [facility] [place] Sent: 04/11/2024   3:59 PM EST To: Zelphia Cap, MD

## 2024-04-15 ENCOUNTER — Inpatient Hospital Stay

## 2024-04-15 VITALS — BP 168/76 | HR 65 | Temp 96.6°F

## 2024-04-15 DIAGNOSIS — D508 Other iron deficiency anemias: Secondary | ICD-10-CM

## 2024-04-15 MED ORDER — IRON SUCROSE 20 MG/ML IV SOLN
200.0000 mg | Freq: Once | INTRAVENOUS | Status: AC
  Administered 2024-04-15: 200 mg via INTRAVENOUS
  Filled 2024-04-15: qty 10

## 2024-04-15 NOTE — Patient Instructions (Signed)

## 2024-04-20 ENCOUNTER — Inpatient Hospital Stay

## 2024-04-20 VITALS — BP 141/67 | HR 76 | Temp 96.0°F | Resp 18

## 2024-04-20 DIAGNOSIS — D508 Other iron deficiency anemias: Secondary | ICD-10-CM | POA: Diagnosis not present

## 2024-04-20 MED ORDER — IRON SUCROSE 20 MG/ML IV SOLN
200.0000 mg | Freq: Once | INTRAVENOUS | Status: AC
  Administered 2024-04-20: 08:00:00 200 mg via INTRAVENOUS

## 2024-04-25 ENCOUNTER — Inpatient Hospital Stay

## 2024-04-25 VITALS — BP 148/86 | HR 66 | Temp 97.9°F | Resp 18

## 2024-04-25 DIAGNOSIS — D508 Other iron deficiency anemias: Secondary | ICD-10-CM | POA: Diagnosis not present

## 2024-04-25 MED ORDER — IRON SUCROSE 20 MG/ML IV SOLN
200.0000 mg | Freq: Once | INTRAVENOUS | Status: AC
  Administered 2024-04-25: 200 mg via INTRAVENOUS
  Filled 2024-04-25: qty 10

## 2024-04-25 NOTE — Patient Instructions (Signed)

## 2024-06-02 ENCOUNTER — Other Ambulatory Visit: Payer: Self-pay | Admitting: Family Medicine

## 2024-06-02 DIAGNOSIS — Z1231 Encounter for screening mammogram for malignant neoplasm of breast: Secondary | ICD-10-CM

## 2024-07-07 ENCOUNTER — Encounter

## 2024-07-07 ENCOUNTER — Inpatient Hospital Stay

## 2024-07-14 ENCOUNTER — Inpatient Hospital Stay

## 2024-07-14 ENCOUNTER — Inpatient Hospital Stay: Admitting: Oncology
